# Patient Record
Sex: Female | Born: 1990 | Race: Black or African American | Hispanic: No | Smoking: Never smoker
Health system: Southern US, Community
[De-identification: ages and names within clinical notes are randomized; demographics above are authoritative.]

---

## 2011-06-23 ENCOUNTER — Other Ambulatory Visit: Payer: Self-pay | Admitting: Family Medicine

## 2011-06-23 DIAGNOSIS — N644 Mastodynia: Secondary | ICD-10-CM

## 2011-06-25 ENCOUNTER — Ambulatory Visit
Admission: RE | Admit: 2011-06-25 | Discharge: 2011-06-25 | Disposition: A | Discharge status: Home / Self Care | Payer: PRIVATE HEALTH INSURANCE | Source: Ambulatory Visit | Attending: Family Medicine | Admitting: Family Medicine

## 2011-06-25 DIAGNOSIS — N644 Mastodynia: Secondary | ICD-10-CM

## 2012-11-25 ENCOUNTER — Ambulatory Visit: Payer: Self-pay | Admitting: Physician Assistant

## 2012-11-25 VITALS — BP 100/60 | HR 68 | Temp 98.5°F | Resp 18 | Ht 66.5 in | Wt 157.8 lb

## 2012-11-25 DIAGNOSIS — B9689 Other specified bacterial agents as the cause of diseases classified elsewhere: Secondary | ICD-10-CM

## 2012-11-25 DIAGNOSIS — N898 Other specified noninflammatory disorders of vagina: Secondary | ICD-10-CM

## 2012-11-25 DIAGNOSIS — N72 Inflammatory disease of cervix uteri: Secondary | ICD-10-CM

## 2012-11-25 LAB — POCT WET PREP WITH KOH
KOH Prep POC: NEGATIVE
RBC Wet Prep HPF POC: NEGATIVE
Yeast Wet Prep HPF POC: NEGATIVE

## 2012-11-25 MED ORDER — METRONIDAZOLE 500 MG PO TABS: 500.0000 mg | ORAL_TABLET | Freq: Two times a day (BID) | ORAL | Status: DC

## 2012-11-25 NOTE — Progress Notes (Signed)
Subjective:    Patient ID: Lauren Crawford, female    DOB: 06/14/1990, 22 y.o.   MRN: [id number]  HPI   Lauren Crawford is a pleasant 22 yr old female here with concern for vaginal discharge.  Noticed this two days ago. Discharge is associated with a fishy odor.  Has some irritation and itching as well.  Has had BV in the past and this is similar to other episodes.  She denies urinary symptoms.  LMP 11/06/12.  She is sexually active with 1 female partner.  No condoms.  No concern for STI, declines testing today.  Last testing about 1 yr ago, same partner.  She is not using anything for contraception.  She is not trying to conceive.  Tried nuvaring but didn't like it due to breast tenderness.  Has never tried anything else.  Concerned for SEs and does not want to take a pill everyday.     Review of Systems  Constitutional: Negative.   HENT: Negative.   Respiratory: Negative.   Cardiovascular: Negative.   Gastrointestinal: Negative.   Genitourinary: Positive for vaginal discharge. Negative for dysuria, urgency, frequency, hematuria, flank pain, genital sores, menstrual problem, pelvic pain and dyspareunia.  Musculoskeletal: Negative.   Skin: Negative.   Neurological: Negative.        Objective:   Physical Exam  Vitals reviewed. Constitutional: She is oriented to person, place, and time. She appears well-developed and well-nourished. No distress.  HENT:  Head: Normocephalic and atraumatic.  Eyes: Conjunctivae are normal. No scleral icterus.  Cardiovascular: Normal rate, regular rhythm and normal heart sounds.   Pulmonary/Chest: Effort normal and breath sounds normal. She has no wheezes. She has no rales.  Abdominal: Soft. Bowel sounds are normal. There is no tenderness.  Genitourinary: Uterus normal. There is no rash, tenderness or lesion on the right labia. There is no rash, tenderness or lesion on the left labia. Cervix exhibits no motion tenderness, no discharge and no friability. Right adnexum  displays no mass, no tenderness and no fullness. Left adnexum displays no mass, no tenderness and no fullness. Vaginal discharge (think, white) found.  Neurological: She is alert and oriented to person, place, and time.  Skin: Skin is warm and dry.  Psychiatric: She has a normal mood and affect. Her behavior is normal.    Results for orders placed in visit on 11/25/12  POCT WET PREP WITH KOH      Result Value Range   Trichomonas, UA Negative     Clue Cells Wet Prep HPF POC 0-6     Epithelial Wet Prep HPF POC 21-30     Yeast Wet Prep HPF POC neg     Bacteria Wet Prep HPF POC 4+     RBC Wet Prep HPF POC neg     WBC Wet Prep HPF POC 0-2     KOH Prep POC Negative         Assessment & Plan:  Bacterial vaginosis - Plan: metroNIDAZOLE (FLAGYL) 500 MG tablet  Discharge from the vagina - Plan: POCT Wet Prep with KOH, CANCELED: POCT urinalysis dipstick, CANCELED: POCT UA - Microscopic Only   Lauren Crawford is a 22 yr old female with bacterial vaginosis.  Will treat with PO Flagyl x 7 days.  Discussed avoidance of etoh.  Discussed contraception options with pt.  She is averse to taking a pill daily but may be open to other methods.  I have provided educational materials to her at her request.  Pt to follow  up if she would like to begin contraception or if symptoms do not improve.

## 2012-11-25 NOTE — Patient Instructions (Signed)
Take the metronidazole as directed.  DO NOT CONSUME ALCOHOL WITH THIS MEDICATION or for 48 hours after your last dose.  Please let us know if any symptoms are worsening or not improving.  Bacterial Vaginosis Bacterial vaginosis (BV) is a vaginal infection where the normal balance of bacteria in the vagina is disrupted. The normal balance is then replaced by an overgrowth of certain bacteria. There are several different kinds of bacteria that can cause BV. BV is the most common vaginal infection in women of childbearing age. CAUSES   The cause of BV is not fully understood. BV develops when there is an increase or imbalance of harmful bacteria.  Some activities or behaviors can upset the normal balance of bacteria in the vagina and put women at increased risk including:  Having a new sex partner or multiple sex partners.  Douching.  Using an intrauterine device (IUD) for contraception.  It is not clear what role sexual activity plays in the development of BV. However, women that have never had sexual intercourse are rarely infected with BV. Women do not get BV from toilet seats, bedding, swimming pools or from touching objects around them.  SYMPTOMS   Grey vaginal discharge.  A fish-like odor with discharge, especially after sexual intercourse.  Itching or burning of the vagina and vulva.  Burning or pain with urination.  Some women have no signs or symptoms at all. DIAGNOSIS  Your caregiver must examine the vagina for signs of BV. Your caregiver will perform lab tests and look at the sample of vaginal fluid through a microscope. They will look for bacteria and abnormal cells (clue cells), a pH test higher than 4.5, and a positive amine test all associated with BV.  RISKS AND COMPLICATIONS   Pelvic inflammatory disease (PID).  Infections following gynecology surgery.  Developing HIV.  Developing herpes virus. TREATMENT  Sometimes BV will clear up without treatment. However,  all women with symptoms of BV should be treated to avoid complications, especially if gynecology surgery is planned. Female partners generally do not need to be treated. However, BV may spread between female sex partners so treatment is helpful in preventing a recurrence of BV.   BV may be treated with antibiotics. The antibiotics come in either pill or vaginal cream forms. Either can be used with nonpregnant or pregnant women, but the recommended dosages differ. These antibiotics are not harmful to the baby.  BV can recur after treatment. If this happens, a second round of antibiotics will often be prescribed.  Treatment is important for pregnant women. If not treated, BV can cause a premature delivery, especially for a pregnant woman who had a premature birth in the past. All pregnant women who have symptoms of BV should be checked and treated.  For chronic reoccurrence of BV, treatment with a type of prescribed gel vaginally twice a week is helpful. HOME CARE INSTRUCTIONS   Finish all medication as directed by your caregiver.  Do not have sex until treatment is completed.  Tell your sexual partner that you have a vaginal infection. They should see their caregiver and be treated if they have problems, such as a mild rash or itching.  Practice safe sex. Use condoms. Only have 1 sex partner. PREVENTION  Basic prevention steps can help reduce the risk of upsetting the natural balance of bacteria in the vagina and developing BV:  Do not have sexual intercourse (be abstinent).  Do not douche.  Use all of the medicine prescribed for treatment  of BV, even if the signs and symptoms go away.  Tell your sex partner if you have BV. That way, they can be treated, if needed, to prevent reoccurrence. SEEK MEDICAL CARE IF:   Your symptoms are not improving after 3 days of treatment.  You have increased discharge, pain, or fever. MAKE SURE YOU:   Understand these instructions.  Will watch your  condition.  Will get help right away if you are not doing well or get worse. FOR MORE INFORMATION  Division of STD Prevention (DSTDP), Centers for Disease Control and Prevention: [employer] American Social Health Association (ASHA): www.ashastd.org  Document Released: 02/03/2005 Document Revised: 04/28/2011 Document Reviewed: 07/27/2008 [facility] Patient Information 2014 [place], [place].

## 2012-12-02 ENCOUNTER — Ambulatory Visit: Payer: Self-pay | Admitting: Family Medicine

## 2012-12-02 ENCOUNTER — Encounter (HOSPITAL_COMMUNITY): Payer: Self-pay | Admitting: Emergency Medicine

## 2012-12-02 ENCOUNTER — Telehealth: Payer: Self-pay | Admitting: *Deleted

## 2012-12-02 ENCOUNTER — Emergency Department (HOSPITAL_COMMUNITY): Payer: PRIVATE HEALTH INSURANCE

## 2012-12-02 ENCOUNTER — Emergency Department (HOSPITAL_COMMUNITY)
Admission: EM | Admit: 2012-12-02 | Discharge: 2012-12-02 | Disposition: A | Discharge status: Home / Self Care | Payer: Self-pay | Attending: Emergency Medicine | Admitting: Emergency Medicine

## 2012-12-02 ENCOUNTER — Encounter: Payer: Self-pay | Admitting: Family Medicine

## 2012-12-02 VITALS — BP 120/76 | HR 80 | Temp 98.3°F | Resp 16 | Ht 67.0 in | Wt 158.4 lb

## 2012-12-02 DIAGNOSIS — N898 Other specified noninflammatory disorders of vagina: Secondary | ICD-10-CM | POA: Insufficient documentation

## 2012-12-02 DIAGNOSIS — R1031 Right lower quadrant pain: Secondary | ICD-10-CM

## 2012-12-02 DIAGNOSIS — R110 Nausea: Secondary | ICD-10-CM | POA: Insufficient documentation

## 2012-12-02 DIAGNOSIS — Z3202 Encounter for pregnancy test, result negative: Secondary | ICD-10-CM | POA: Insufficient documentation

## 2012-12-02 DIAGNOSIS — N390 Urinary tract infection, site not specified: Secondary | ICD-10-CM

## 2012-12-02 DIAGNOSIS — Z792 Long term (current) use of antibiotics: Secondary | ICD-10-CM | POA: Insufficient documentation

## 2012-12-02 DIAGNOSIS — Z79899 Other long term (current) drug therapy: Secondary | ICD-10-CM | POA: Insufficient documentation

## 2012-12-02 DIAGNOSIS — N926 Irregular menstruation, unspecified: Secondary | ICD-10-CM

## 2012-12-02 LAB — POCT UA - MICROSCOPIC ONLY
Casts, Ur, LPF, POC: NEGATIVE
Crystals, Ur, HPF, POC: NEGATIVE
Mucus, UA: NEGATIVE
Yeast, UA: NEGATIVE

## 2012-12-02 LAB — COMPREHENSIVE METABOLIC PANEL
ALT: 15 U/L (ref 0–35)
AST: 23 U/L (ref 0–37)
Albumin: 4.2 g/dL (ref 3.5–5.2)
Calcium: 9 mg/dL (ref 8.4–10.5)
Chloride: 103 mEq/L (ref 96–112)
Creatinine, Ser: 0.75 mg/dL (ref 0.50–1.10)
Potassium: 4.1 mEq/L (ref 3.5–5.1)
Sodium: 139 mEq/L (ref 135–145)
Total Bilirubin: 0.4 mg/dL (ref 0.3–1.2)

## 2012-12-02 LAB — WET PREP, GENITAL
Clue Cells Wet Prep HPF POC: NONE SEEN
Trich, Wet Prep: NONE SEEN

## 2012-12-02 LAB — URINALYSIS, ROUTINE W REFLEX MICROSCOPIC
Glucose, UA: NEGATIVE mg/dL
Protein, ur: 100 mg/dL — AB
Specific Gravity, Urine: 1.011 (ref 1.005–1.030)

## 2012-12-02 LAB — POCT URINALYSIS DIPSTICK
Bilirubin, UA: NEGATIVE
Glucose, UA: NEGATIVE
Ketones, UA: NEGATIVE
Nitrite, UA: POSITIVE
Protein, UA: 100
Spec Grav, UA: 1.015
Urobilinogen, UA: 0.2
pH, UA: 6

## 2012-12-02 LAB — CBC WITH DIFFERENTIAL/PLATELET
Basophils Absolute: 0 10*3/uL (ref 0.0–0.1)
Basophils Relative: 0 % (ref 0–1)
Eosinophils Relative: 0 % (ref 0–5)
HCT: 37.5 % (ref 36.0–46.0)
Hemoglobin: 12.8 g/dL (ref 12.0–15.0)
Lymphocytes Relative: 22 % (ref 12–46)
MCHC: 34.1 g/dL (ref 30.0–36.0)
MCV: 88 fL (ref 78.0–100.0)
Monocytes Absolute: 0.6 10*3/uL (ref 0.1–1.0)
Monocytes Relative: 8 % (ref 3–12)
RDW: 12.3 % (ref 11.5–15.5)

## 2012-12-02 LAB — POCT CBC
Granulocyte percent: 61.6 %G (ref 37–80)
HCT, POC: 38.8 % (ref 37.7–47.9)
Hemoglobin: 11.9 g/dL — AB (ref 12.2–16.2)
Lymph, poc: 2 (ref 0.6–3.4)
MCH, POC: 29.2 pg (ref 27–31.2)
MCHC: 30.7 g/dL — AB (ref 31.8–35.4)
MCV: 95.2 fL (ref 80–97)
MID (cbc): 0.3 (ref 0–0.9)
MPV: 8.2 fL (ref 0–99.8)
POC Granulocyte: 3.7 (ref 2–6.9)
POC LYMPH PERCENT: 33.5 % (ref 10–50)
POC MID %: 4.9 % (ref 0–12)
Platelet Count, POC: 215 10*3/uL (ref 142–424)
RBC: 4.08 M/uL (ref 4.04–5.48)
RDW, POC: 13.3 %
WBC: 6 10*3/uL (ref 4.6–10.2)

## 2012-12-02 LAB — URINE MICROSCOPIC-ADD ON

## 2012-12-02 LAB — POCT URINE PREGNANCY: Preg Test, Ur: NEGATIVE

## 2012-12-02 LAB — POCT I-STAT, CHEM 8
Chloride: 102 mEq/L (ref 96–112)
Glucose, Bld: 84 mg/dL (ref 70–99)
HCT: 42 % (ref 36.0–46.0)
Potassium: 3.6 mEq/L (ref 3.5–5.1)

## 2012-12-02 LAB — POCT PREGNANCY, URINE: Preg Test, Ur: NEGATIVE

## 2012-12-02 MED ORDER — DOXYCYCLINE HYCLATE 100 MG PO CAPS: 100.0000 mg | ORAL_CAPSULE | Freq: Two times a day (BID) | ORAL | Status: DC

## 2012-12-02 MED ORDER — CEPHALEXIN 500 MG PO CAPS: 500.0000 mg | ORAL_CAPSULE | Freq: Four times a day (QID) | ORAL | Status: DC

## 2012-12-02 MED ORDER — HYDROMORPHONE HCL PF 1 MG/ML IJ SOLN
0.5000 mg | Freq: Once | INTRAMUSCULAR | Status: AC
  Administered 2012-12-02: 0.5 mg via INTRAVENOUS
  Filled 2012-12-02: qty 1

## 2012-12-02 MED ORDER — AZITHROMYCIN 1 G PO PACK
1.0000 g | PACK | Freq: Once | ORAL | Status: AC
  Administered 2012-12-02: 1 g via ORAL
  Filled 2012-12-02: qty 1

## 2012-12-02 MED ORDER — DEXTROSE 5 % IV SOLN: 1.0000 g | Freq: Once | INTRAVENOUS | Status: DC

## 2012-12-02 MED ORDER — CIPROFLOXACIN HCL 500 MG PO TABS: 500.0000 mg | ORAL_TABLET | Freq: Two times a day (BID) | ORAL | Status: DC

## 2012-12-02 MED ORDER — IOHEXOL 300 MG/ML  SOLN
100.0000 mL | Freq: Once | INTRAMUSCULAR | Status: AC | PRN
  Administered 2012-12-02: 100 mL via INTRAVENOUS

## 2012-12-02 MED ORDER — CEFTRIAXONE SODIUM 1 G IJ SOLR
1.0000 g | Freq: Once | INTRAMUSCULAR | Status: AC
  Administered 2012-12-02: 1 g via INTRAMUSCULAR
  Filled 2012-12-02: qty 10

## 2012-12-02 NOTE — ED Provider Notes (Signed)
CSN: [id number]     Arrival date & time 12/02/12  1807 History   First MD Initiated Contact with Patient 12/02/12 1824     Chief Complaint  Patient presents with  . Abdominal Pain   (Consider location/radiation/quality/duration/timing/severity/associated sxs/prior Treatment) HPI Comments: Patient presents to the Ed with Right sided abdominal pain with radiation to back.  She reports abrupt onset of 10/10 pain started to day at 0700 with nausea without vomiting. She reports discomfort that lasts for 2 minutes. Further questioning revealed she was also seen by Dr. Lenell Antu, DO today. Reports sexually active without use of condoms. States she had malodors discharge for one week and was prescribed Metronidazole for BV 1 week ago and Cipro. Denies previous abdominal surgeries.  LNMP: 11/26/2012     Patient is a 22 y.o. female presenting with abdominal pain. The history is provided by the patient.  Abdominal Pain   History reviewed. No pertinent past medical history. History reviewed. No pertinent past surgical history. Family History  Problem Relation Age of Onset  . Cancer Maternal Grandmother     throat   History  Substance Use Topics  . Smoking status: Never Smoker   . Smokeless tobacco: Not on file  . Alcohol Use: No   OB History   Grav Para Term Preterm Abortions TAB SAB Ect Mult Living                 Review of Systems  Gastrointestinal: Positive for abdominal pain.  All other systems reviewed and are negative.    Allergies  Review of patient's allergies indicates no known allergies.  Home Medications   Current Outpatient Rx  Name  Route  Sig  Dispense  Refill  . ciprofloxacin (CIPRO) 500 MG tablet   Oral   Take 1 tablet (500 mg total) by mouth 2 (two) times daily.   20 tablet   0   . metroNIDAZOLE (FLAGYL) 500 MG tablet   Oral   Take 1 tablet (500 mg total) by mouth 2 (two) times daily with a meal. DO NOT CONSUME ALCOHOL WHILE TAKING THIS MEDICATION.   14  tablet   0    BP 111/77  Pulse 86  Temp(Src) 97.9 F (36.6 C) (Oral)  Resp 18  Ht 5\' 7"  (1.702 m)  Wt 160 lb (72.576 kg)  BMI 25.05 kg/m2  SpO2 100%  LMP 11/01/2012 Physical Exam  Nursing note and vitals reviewed. Constitutional: She appears well-developed and well-nourished.  HENT:  Head: Normocephalic and atraumatic.  Eyes: EOM are normal.  Neck: Neck supple.  Cardiovascular: Normal rate, regular rhythm and normal heart sounds.   Pulmonary/Chest: Effort normal and breath sounds normal. No respiratory distress. She has no wheezes. She has no rales.  Abdominal: Soft. Normal appearance. Bowel sounds are increased. There is tenderness in the right upper quadrant and right lower quadrant. There is guarding and CVA tenderness. There is no rigidity, no rebound and negative Murphy's sign.  Right CVA tenderness  Genitourinary: Pelvic exam was performed with patient supine. There is no rash or lesion on the right labia. There is no rash or lesion on the left labia. Cervix exhibits discharge. Cervix exhibits no motion tenderness. Right adnexum displays no mass and no tenderness. Left adnexum displays no mass and no tenderness. Vaginal discharge found.  Scant yellow discharge.  Musculoskeletal: She exhibits no edema.  Neurological: She is alert.  Skin: Skin is warm and dry. No rash noted.  Psychiatric: She has a normal mood  and affect.    ED Course  Procedures (including critical care time) Labs Review Labs Reviewed  URINALYSIS, ROUTINE W REFLEX MICROSCOPIC - Abnormal; Notable for the following:    APPearance TURBID (*)    Hgb urine dipstick LARGE (*)    Protein, ur 100 (*)    Nitrite POSITIVE (*)    Leukocytes, UA LARGE (*)    All other components within normal limits  URINE MICROSCOPIC-ADD ON - Abnormal; Notable for the following:    Bacteria, UA MANY (*)    All other components within normal limits  GC/CHLAMYDIA PROBE AMP  URINE CULTURE  WET PREP, GENITAL  WET PREP, GENITAL   CBC WITH DIFFERENTIAL  COMPREHENSIVE METABOLIC PANEL  POCT I-STAT, CHEM 8  POCT PREGNANCY, URINE   Imaging Review Ct Abdomen Pelvis W Contrast  12/02/2012   CLINICAL DATA:  Severe right lower quadrant abdominal pain radiating to the back.  EXAM: CT ABDOMEN AND PELVIS WITH CONTRAST  TECHNIQUE: Multidetector CT imaging of the abdomen and pelvis was performed using the standard protocol following bolus administration of intravenous contrast.  CONTRAST:  OMNIPAQUE IOHEXOL 300 MG/ML  SOLN  COMPARISON:  No priors.  FINDINGS: Lung Bases: Unremarkable.  Abdomen/Pelvis: The appearance of the liver, gallbladder, pancreas, spleen, bilateral adrenal glands and bilateral kidneys is unremarkable. No significant volume of ascites. No pneumoperitoneum. No pathologic distention of small bowel. No definite lymphadenopathy identified within the abdomen or pelvis. Normal appendix. Uterus and bilateral ovaries are unremarkable in appearance. Urinary bladder appears slightly thickened, but is otherwise unremarkable.  Musculoskeletal: There are no aggressive appearing lytic or blastic lesions noted in the visualized portions of the skeleton.  IMPRESSION: 1. Mild circumferential thickening of the urinary bladder wall may suggest cystitis. No other potential acute findings are noted in the abdomen and pelvis. 2. Specifically, the appendix is normal.   Electronically Signed   By: Trudie Reed M.D.   On: 12/02/2012 20:39    EKG Interpretation   None       MDM   1. UTI (urinary tract infection)   2. Vaginal discharge    Patient presents with R sided abdominal pain, EMR reveals outside work up.  Consider UTI, Kidney stone, Appendicitis, PID   Reevaluation: patient reports pain is now at a 2/10 after medication. Discussed UA with patient.  CT- negative for acute findings. Discussed results with patient and will give her a dose of rocephin here discharge her home with antibiotics for UTI, and treat her for  presumptive GC/C and follow up with PCP.  Meds given in ED:  Medications   HYDROmorphone (DILAUDID) injection 0.5 mg (0.5 mg Intravenous Given 12/02/12 1936)   iohexol (OMNIPAQUE) 300 MG/ML solution 100 mL (100 mLs Intravenous Contrast Given 12/02/12 2019)   cefTRIAXone (ROCEPHIN) injection 1 g (1 g Intramuscular Given 12/02/12 2128)   azithromycin (ZITHROMAX) powder 1 g (1 g Oral Given 12/02/12 2128)    Discharge Medication List as of 12/02/2012 9:19 PM     START taking these medications    Details   cephALEXin (KEFLEX) 500 MG capsule  Take 1 capsule (500 mg total) by mouth 4 (four) times daily., Starting 12/02/2012, Until Discontinued, Print     doxycycline (VIBRAMYCIN) 100 MG capsule  Take 1 capsule (100 mg total) by mouth 2 (two) times daily., Starting 12/02/2012, Until Discontinued, Print            Clabe Seal, PA-C 12/04/12 1609

## 2012-12-02 NOTE — ED Notes (Signed)
Pt finished oral contrast.  CT notified.

## 2012-12-02 NOTE — Progress Notes (Addendum)
[facility]:  Office Visit  Chief Complaint:  Chief Complaint  Patient presents with  . Abdominal Pain    being treated for BV x 2 days    HPI: Lauren Crawford is a 22 y.o. female who is here for right lower quadrant abd pain starting 6 am today after she ate oatmeal and tea, no other foods, from [employer] to take with her Flagyl. She is also on herbalife products and has been drinking that every day and has not had any problems. Associated with nausea, no vomiting. She has sharp and dull pain, constant, 10/10 with sharp pain. She ate a yogurt parfait and was able to tolerate it but was already in pain. She has chills when she has pain. Still has itching and smell in urine, She has 1 day left of her BV medication. + lower right back pain  No prior history of kidney stones, denies STDs, she denies ovarian cysts  She just got off her period but had 2 last month  History reviewed. No pertinent past medical history. History reviewed. No pertinent past surgical history. History   Social History  . Marital Status: Single    Spouse Name: N/A    Number of Children: N/A  . Years of Education: N/A   Social History Main Topics  . Smoking status: Never Smoker   . Smokeless tobacco: None  . Alcohol Use: No  . Drug Use: No  . Sexual Activity: Yes   Other Topics Concern  . None   Social History Narrative  . None   Family History  Problem Relation Age of Onset  . Cancer Maternal Grandmother     throat   No Known Allergies Prior to Admission medications   Medication Sig Start Date End Date Taking? Authorizing Provider  metroNIDAZOLE (FLAGYL) 500 MG tablet Take 1 tablet (500 mg total) by mouth 2 (two) times daily with a meal. DO NOT CONSUME ALCOHOL WHILE TAKING THIS MEDICATION. 11/25/12  Yes Eleanore E Debbra Riding, PA-C     ROS: The patient denies fevers, chills, night sweats, unintentional weight loss, chest pain, palpitations, wheezing, dyspnea on exertion, nausea, vomiting,  melena, numbness, weakness, or tingling.  All other systems have been reviewed and were otherwise negative with the exception of those mentioned in the HPI and as above.    PHYSICAL EXAM: Filed Vitals:   12/02/12 1547  BP: 120/76  Pulse: 80  Temp: 98.3 F (36.8 C)  Resp: 16   Filed Vitals:   12/02/12 1547  Height: 5\' 7"  (1.702 m)  Weight: 158 lb 6.4 oz (71.85 kg)   Body mass index is 24.8 kg/(m^2).  General: Alert, no acute distress HEENT:  Normocephalic, atraumatic, oropharynx patent. EOMI, PERRLA Cardiovascular:  Regular rate and rhythm, no rubs murmurs or gallops.  No Carotid bruits, radial pulse intact. No pedal edema.  Respiratory: Clear to auscultation bilaterally.  No wheezes, rales, or rhonchi.  No cyanosis, no use of accessory musculature GI: No organomegaly, abdomen is soft and non-tender, positive bowel sounds.  No masses. Skin: No rashes. Neurologic: Facial musculature symmetric. Psychiatric: Patient is appropriate throughout our interaction. Lymphatic: No cervical lymphadenopathy Musculoskeletal: Gait intact. No rebound tenderness + guarding + exqusiitely tender on RLQ + CVA tenderness   LABS: Results for orders placed in visit on 12/02/12  POCT CBC      Result Value Range   WBC 6.0  4.6 - 10.2 K/uL   Lymph, poc 2.0  0.6 - 3.4   POC  LYMPH PERCENT 33.5  10 - 50 %L   MID (cbc) 0.3  0 - 0.9   POC MID % 4.9  0 - 12 %M   POC Granulocyte 3.7  2 - 6.9   Granulocyte percent 61.6  37 - 80 %G   RBC 4.08  4.04 - 5.48 M/uL   Hemoglobin 11.9 (*) 12.2 - 16.2 g/dL   HCT, POC 40.9  81.1 - 47.9 %   MCV 95.2  80 - 97 fL   MCH, POC 29.2  27 - 31.2 pg   MCHC 30.7 (*) 31.8 - 35.4 g/dL   RDW, POC 91.4     Platelet Count, POC 215  142 - 424 K/uL   MPV 8.2  0 - 99.8 fL  POCT UA - MICROSCOPIC ONLY      Result Value Range   WBC, Ur, HPF, POC tntc     RBC, urine, microscopic tntc     Bacteria, U Microscopic 4+     Mucus, UA neg     Epithelial cells, urine per micros  0-1     Crystals, Ur, HPF, POC neg     Casts, Ur, LPF, POC neg     Yeast, UA neg    POCT URINALYSIS DIPSTICK      Result Value Range   Color, UA yellow     Clarity, UA turbid     Glucose, UA neg     Bilirubin, UA neg     Ketones, UA neg     Spec Grav, UA 1.015     Blood, UA large     pH, UA 6.0     Protein, UA 100     Urobilinogen, UA 0.2     Nitrite, UA positive     Leukocytes, UA large (3+)    POCT URINE PREGNANCY      Result Value Range   Preg Test, Ur Negative       EKG/XRAY:   Primary read interpreted by Dr. Conley Rolls at [facility].   ASSESSMENT/PLAN: Encounter Diagnoses  Name Primary?  . Abdominal pain, acute, right lower quadrant Yes  . UTI (urinary tract infection)   . Irregular menses    She has a UTI After much discussion about her sxs and the possible causes she declined to get further outpatient care and wanted to go to ER for more definitive dx with CT scan Patient wanted to go to ER instead of getting out patient CT, she is exquisitely tender and guarding  so possible early appendicitis vs ovarian cyst (just finished having her period) , declined rocephin injection in office Rx Cipro 500 mg BID x 10 Days sent to pharmacy Will culture urine Will get uriprobe for GC She is going to ER by personal vehicle since wants definitive dx now Gross sideeffects, risk and benefits, and alternatives of medications d/w patient. Patient is aware that all medications have potential sideeffects and we are unable to predict every sideeffect or drug-drug interaction that may occur.  Hamilton Capri PHUONG, DO 12/02/2012 6:13 PM

## 2012-12-02 NOTE — ED Notes (Addendum)
Pt states she was tx with flagyl for a vaginal infection 1 week ago.  Pt began experiencing severe RLQ pain that radiates to her back while at work today.  Pt states her urine still has a "fishy smell", is cloudy and burns when she urinates.

## 2012-12-02 NOTE — ED Provider Notes (Signed)
22 year old female with no prior abdominal surgical history who presents from the urgent care with a diagnosis of a urinary infection with right lower quadrant pain. The providers at the urgent care wanted to pursue further evaluation but the patient said she wanted to come to the emergency department instead. On my exam she has tenderness in the right lower quadrant with guarding, no other abdominal tenderness and no peritoneal signs. She had a normal blood count that was drawn just prior to arrival, her urinalysis does reveal urinary infection. Because of the location of her pain we'll also consider appendicitis as a possible source of her pyuria. Otherwise the patient appears hemodynamically stable and is pain-free and was examined after receiving intravenous medications.  CT negative  Medical screening examination/treatment/procedure(s) were conducted as a shared visit with non-physician practitioner(s) and myself.  I personally evaluated the patient during the encounter.  Clinical Impression: Abdominal Pain, UTI      Vida Roller, MD 12/03/12 0001

## 2012-12-02 NOTE — Telephone Encounter (Signed)
Per Dr. Conley Rolls, called and left message on cell phone negative pregnancy test.  Carollee Leitz, CMA.

## 2012-12-03 ENCOUNTER — Encounter: Payer: Self-pay | Admitting: Family Medicine

## 2012-12-03 LAB — GC/CHLAMYDIA PROBE AMP
CT Probe RNA: NEGATIVE
CT Probe RNA: NEGATIVE
GC Probe RNA: NEGATIVE
GC Probe RNA: NEGATIVE

## 2012-12-04 LAB — URINE CULTURE
Colony Count: >=100000
Colony Count: >=100000

## 2012-12-05 ENCOUNTER — Telehealth (HOSPITAL_COMMUNITY): Payer: Self-pay | Admitting: Emergency Medicine

## 2012-12-05 NOTE — ED Notes (Signed)
Post ED Visit - Positive Culture Follow-up ° °Culture report reviewed by antimicrobial stewardship pharmacist: °[] Wes Dulaney, Pharm.D., BCPS °[] Jeremy Frens, Pharm.D., BCPS °[] Elizabeth Martin, Pharm.D., BCPS °[] Minh Pham, Pharm.D., BCPS, AAHIVP °[] Michelle Turner, Pharm.D., BCPS, AAHIVP °[x] Nita Johnston, Pharm.D., BCPS ° °Positive urine culture °Treated with Keflex, organism sensitive to the same and no further patient follow-up is required at this time. ° °Lauren Crawford °12/05/2012, 1:23 PM ° ° °

## 2012-12-06 NOTE — ED Provider Notes (Signed)
Medical screening examination/treatment/procedure(s) were conducted as a shared visit with non-physician practitioner(s) and myself.  I personally evaluated the patient during the encounter  Please see my separate respective documentation pertaining to this patient encounter   Vida Roller, MD 12/06/12 [phone]

## 2013-04-27 ENCOUNTER — Ambulatory Visit (INDEPENDENT_AMBULATORY_CARE_PROVIDER_SITE_OTHER): Payer: BC Managed Care – PPO | Admitting: Family Medicine

## 2013-04-27 VITALS — BP 120/70 | HR 88 | Temp 98.4°F | Resp 16 | Ht 67.0 in | Wt 160.0 lb

## 2013-04-27 DIAGNOSIS — R141 Gas pain: Secondary | ICD-10-CM

## 2013-04-27 DIAGNOSIS — R140 Abdominal distension (gaseous): Secondary | ICD-10-CM

## 2013-04-27 DIAGNOSIS — Z3009 Encounter for other general counseling and advice on contraception: Secondary | ICD-10-CM

## 2013-04-27 DIAGNOSIS — R1032 Left lower quadrant pain: Secondary | ICD-10-CM

## 2013-04-27 DIAGNOSIS — D649 Anemia, unspecified: Secondary | ICD-10-CM

## 2013-04-27 DIAGNOSIS — K5900 Constipation, unspecified: Secondary | ICD-10-CM

## 2013-04-27 DIAGNOSIS — R142 Eructation: Secondary | ICD-10-CM

## 2013-04-27 DIAGNOSIS — R143 Flatulence: Secondary | ICD-10-CM

## 2013-04-27 LAB — POCT CBC
Granulocyte percent: 49.9 %G (ref 37–80)
HCT, POC: 35.1 % — AB (ref 37.7–47.9)
Hemoglobin: 10.8 g/dL — AB (ref 12.2–16.2)
Lymph, poc: 1.7 (ref 0.6–3.4)
MCH, POC: 28.5 pg (ref 27–31.2)
MCHC: 30.8 g/dL — AB (ref 31.8–35.4)
MCV: 92.5 fL (ref 80–97)
MID (cbc): 0.3 (ref 0–0.9)
MPV: 8.7 fL (ref 0–99.8)
POC Granulocyte: 2 (ref 2–6.9)
POC LYMPH %: 43.7 % (ref 10–50)
POC MID %: 6.4 %M (ref 0–12)
Platelet Count, POC: 211 10*3/uL (ref 142–424)
RBC: 3.79 M/uL — AB (ref 4.04–5.48)
RDW, POC: 13 %
WBC: 4 10*3/uL — AB (ref 4.6–10.2)

## 2013-04-27 LAB — POCT UA - MICROSCOPIC ONLY
CASTS, UR, LPF, POC: NEGATIVE
Crystals, Ur, HPF, POC: NEGATIVE
Mucus, UA: NEGATIVE
RBC, URINE, MICROSCOPIC: NEGATIVE
Yeast, UA: NEGATIVE

## 2013-04-27 LAB — POCT URINALYSIS DIPSTICK
Bilirubin, UA: NEGATIVE
Blood, UA: NEGATIVE
Glucose, UA: NEGATIVE
KETONES UA: NEGATIVE
Leukocytes, UA: NEGATIVE
Nitrite, UA: NEGATIVE
PH UA: 7
PROTEIN UA: NEGATIVE
SPEC GRAV UA: 1.02
UROBILINOGEN UA: 0.2

## 2013-04-27 LAB — POCT URINE PREGNANCY: Preg Test, Ur: NEGATIVE

## 2013-04-27 NOTE — Patient Instructions (Addendum)
Look at paperwork on contraception options from Up to Date. , then let me know your preference to determine if we need to refer you to gynecologist.   You should receive a call or letter about your lab results within the next week to 10 days.   Increase fiber and water in diet to lessen chance of constipation, but if bloating not improved in next week or two - can recheck to discuss other causes or refer you to a gastroenterologist.   Recheck blood count in next 2-4 weeks - sooner if fatigue is worsening, but try to eat foods rich in iron until then.   If hard stools, or unable to have bowel movement - Miralax over the counter.   Return to the clinic or go to the nearest emergency room if any of your symptoms worsen or new symptoms occur.  Anemia, Nonspecific Anemia is a condition in which the concentration of red blood cells or hemoglobin in the blood is below normal. Hemoglobin is a substance in red blood cells that carries oxygen to the tissues of the body. Anemia results in not enough oxygen reaching these tissues.  CAUSES  Common causes of anemia include:   Excessive bleeding. Bleeding may be internal or external. This includes excessive bleeding from periods (in women) or from the intestine.   Poor nutrition.   Chronic kidney, thyroid, and liver disease.  Bone marrow disorders that decrease red blood cell production.  Cancer and treatments for cancer.  HIV, AIDS, and their treatments.  Spleen problems that increase red blood cell destruction.  Blood disorders.  Excess destruction of red blood cells due to infection, medicines, and autoimmune disorders. SIGNS AND SYMPTOMS   Minor weakness.   Dizziness.   Headache.  Palpitations.   Shortness of breath, especially with exercise.   Paleness.  Cold sensitivity.  Indigestion.  Nausea.  Difficulty sleeping.  Difficulty concentrating. Symptoms may occur suddenly or they may develop slowly.  DIAGNOSIS   Additional blood tests are often needed. These help your health care provider determine the best treatment. Your health care provider will check your stool for blood and look for other causes of blood loss.  TREATMENT  Treatment varies depending on the cause of the anemia. Treatment can include:   Supplements of iron, vitamin B12, or folic acid.   Hormone medicines.   A blood transfusion. This may be needed if blood loss is severe.   Hospitalization. This may be needed if there is significant continual blood loss.   Dietary changes.  Spleen removal. HOME CARE INSTRUCTIONS Keep all follow-up appointments. It often takes many weeks to correct anemia, and having your health care provider check on your condition and your response to treatment is very important. SEEK IMMEDIATE MEDICAL CARE IF:   You develop extreme weakness, shortness of breath, or chest pain.   You become dizzy or have trouble concentrating.  You develop heavy vaginal bleeding.   You develop a rash.   You have bloody or black, tarry stools.   You faint.   You vomit up blood.   You vomit repeatedly.   You have abdominal pain.  You have a fever or persistent symptoms for more than 2 3 days.   You have a fever and your symptoms suddenly get worse.   You are dehydrated.  MAKE SURE YOU:  Understand these instructions.  Will watch your condition.  Will get help right away if you are not doing well or get worse. Document Released: 03/13/2004 Document  Revised: 10/06/2012 Document Reviewed: 07/30/2012 [facility] Patient Information 2014 [place], [place].    Constipation, Adult Constipation is when a person has fewer than 3 bowel movements a week; has difficulty having a bowel movement; or has stools that are dry, hard, or larger than normal. As people grow older, constipation is more common. If you try to fix constipation with medicines that make you have a bowel movement (laxatives), the  problem may get worse. Long-term laxative use may cause the muscles of the colon to become weak. A low-fiber diet, not taking in enough fluids, and taking certain medicines may make constipation worse. CAUSES   Certain medicines, such as antidepressants, pain medicine, iron supplements, antacids, and water pills.   Certain diseases, such as diabetes, irritable bowel syndrome (IBS), thyroid disease, or depression.   Not drinking enough water.   Not eating enough fiber-rich foods.   Stress or travel.  Lack of physical activity or exercise.  Not going to the restroom when there is the urge to have a bowel movement.  Ignoring the urge to have a bowel movement.  Using laxatives too much. SYMPTOMS   Having fewer than 3 bowel movements a week.   Straining to have a bowel movement.   Having hard, dry, or larger than normal stools.   Feeling full or bloated.   Pain in the lower abdomen.  Not feeling relief after having a bowel movement. DIAGNOSIS  Your caregiver will take a medical history and perform a physical exam. Further testing may be done for severe constipation. Some tests may include:   A barium enema X-ray to examine your rectum, colon, and sometimes, your small intestine.  A sigmoidoscopy to examine your lower colon.  A colonoscopy to examine your entire colon. TREATMENT  Treatment will depend on the severity of your constipation and what is causing it. Some dietary treatments include drinking more fluids and eating more fiber-rich foods. Lifestyle treatments may include regular exercise. If these diet and lifestyle recommendations do not help, your caregiver may recommend taking over-the-counter laxative medicines to help you have bowel movements. Prescription medicines may be prescribed if over-the-counter medicines do not work.  HOME CARE INSTRUCTIONS   Increase dietary fiber in your diet, such as fruits, vegetables, whole grains, and beans. Limit high-fat  and processed sugars in your diet, such as [place] fries, hamburgers, cookies, candies, and soda.   A fiber supplement may be added to your diet if you cannot get enough fiber from foods.   Drink enough fluids to keep your urine clear or pale yellow.   Exercise regularly or as directed by your caregiver.   Go to the restroom when you have the urge to go. Do not hold it.  Only take medicines as directed by your caregiver. Do not take other medicines for constipation without talking to your caregiver first. SEEK IMMEDIATE MEDICAL CARE IF:   You have bright red blood in your stool.   Your constipation lasts for more than 4 days or gets worse.   You have abdominal or rectal pain.   You have thin, pencil-like stools.  You have unexplained weight loss. MAKE SURE YOU:   Understand these instructions.  Will watch your condition.  Will get help right away if you are not doing well or get worse. Document Released: 11/02/2003 Document Revised: 04/28/2011 Document Reviewed: 11/15/2012 [facility] Patient Information 2014 [place], [place].

## 2013-04-27 NOTE — Progress Notes (Addendum)
This chart was scribed for Lauren StaggersJeffrey Fairley Copher, MD by Lauren Crawford, ED Scribe. This patient was seen in room 11 and the patient's care was started at 4:25 PM. Subjective:    Patient ID: Lauren Crawford, female    DOB: 06/26/1990, 23 y.o.   MRN: [id number]  Chief Complaint  Patient presents with  . Constipation    x 2 week  . Bloated  . sleepy    HPI Latina Lauren AngMonde is a 23 y.o. female PCP: No PCP Per Patient  Pt was seen here in the office on 11/2012 for abdominal pain, followed by ED evaluation. Normal CBC, but CT scan was obtained at that ED visit. Mild thickening of urinary bladder wall suggesting cystitis, otherwise negative.   Pt is complaining of constant constipation that started 2 weeks ago. Pt is also complaining of associated bloating and fatigue. She states that she is usually constipated, so this in not unusual for her. Usual BM is once a day. She describes her stool as being hard in nature. She reports taking a stool softener last week, with some relief. What she is really concerned about is her stomach bloating. She denies any history of stomach issues. Pt states that she started experiencing sharp pains in the lower part in her abdomen. Her last BM was this morning. Pt states that she has gained unexpected weight (3lbs) in the last 2 weeks due to her associated symptoms. She's also complaining of pressure with urination but not burning. Denies any emesis, fever, dysuria, frequency, vaginal discharge (had some discharge a few weeks ago, but none recently) or hematuria. Denies any history of STDs.   LMP: 04/14/13; she states that her period usually last for 5 days but this time it lasted for 3 days. Pt states that she uses condoms as a method of birth control. She is not always constant with her birth control method.   Pt is also inquiring about a new method of birth control. She states that she's tried Nuvaring before. She is thinking about getting an IUD. Advised pt that we don't place those  here at the office, but I am more than willing to give her a referral to a local OB/GYN.  There are no active problems to display for this patient.  History reviewed. No pertinent past medical history. History reviewed. No pertinent past surgical history. No Known Allergies Prior to Admission medications   Medication Sig Start Date End Date Taking? Authorizing Provider  cephALEXin (KEFLEX) 500 MG capsule Take 1 capsule (500 mg total) by mouth 4 (four) times daily. 12/02/12   Lauren Doretha ImusM Parker, PA-C  ciprofloxacin (CIPRO) 500 MG tablet Take 1 tablet (500 mg total) by mouth 2 (two) times daily. 12/02/12   Lauren P Le, DO  doxycycline (VIBRAMYCIN) 100 MG capsule Take 1 capsule (100 mg total) by mouth 2 (two) times daily. 12/02/12   Lauren Doretha ImusM Parker, PA-C  metroNIDAZOLE (FLAGYL) 500 MG tablet Take 1 tablet (500 mg total) by mouth 2 (two) times daily with a meal. DO NOT CONSUME ALCOHOL WHILE TAKING THIS MEDICATION. 11/25/12   Lauren PickEleanore E Egan, PA-C   History   Social History  . Marital Status: Single    Spouse Name: N/A    Number of Children: N/A  . Years of Education: N/A   Occupational History  . Not on file.   Social History Main Topics  . Smoking status: Never Smoker   . Smokeless tobacco: Not on file  . Alcohol Use: No  . Drug  Use: No  . Sexual Activity: Yes   Other Topics Concern  . Not on file   Social History Narrative  . No narrative on file   Review of Systems  Constitutional: Positive for fatigue and unexpected weight change.  Respiratory: Negative for chest tightness and shortness of breath.   Cardiovascular: Negative for chest pain, palpitations and leg swelling.  Gastrointestinal: Positive for nausea, constipation and abdominal distention (bloating). Negative for blood in stool.  Genitourinary: Negative for frequency and vaginal discharge.  Neurological: Negative for dizziness, syncope, light-headedness and headaches.       Objective:   Physical Exam  Vitals  reviewed. Constitutional: She is oriented to person, place, and time. She appears well-developed and well-nourished.  HENT:  Head: Normocephalic and atraumatic.  Right Ear: Tympanic membrane normal.  Left Ear: Tympanic membrane normal.  Mouth/Throat: Oropharynx is clear and moist. No posterior oropharyngeal edema or posterior oropharyngeal erythema.  Eyes: Conjunctivae and EOM are normal. Pupils are equal, round, and reactive to light.  Neck: Normal range of motion. Neck supple. Carotid bruit is not present.  Slight prominence of thyroid without nodules.  Cardiovascular: Normal rate, regular rhythm, normal heart sounds and intact distal pulses.  Exam reveals no gallop and no friction rub.   No murmur heard. Pulmonary/Chest: Effort normal and breath sounds normal. No respiratory distress. She has no wheezes. She has no rales.  Abdominal: Soft. She exhibits no pulsatile midline mass. There is no tenderness.  Neurological: She is alert and oriented to person, place, and time.  Skin: Skin is warm and dry.  Psychiatric: She has a normal mood and affect. Her behavior is normal.   Filed Vitals:   04/27/13 1609  BP: 120/70  Pulse: 88  Temp: 98.4 F (36.9 C)  TempSrc: Oral  Resp: 16  Height: 5\' 7"  (1.702 m)  Weight: 160 lb (72.576 kg)  SpO2: 100%    Results for orders placed in visit on 04/27/13  POCT CBC      Result Value Ref Range   WBC 4.0 (*) 4.6 - 10.2 K/uL   Lymph, poc 1.7  0.6 - 3.4   POC LYMPH PERCENT 43.7  10 - 50 %L   MID (cbc) 0.3  0 - 0.9   POC MID % 6.4  0 - 12 %M   POC Granulocyte 2.0  2 - 6.9   Granulocyte percent 49.9  37 - 80 %G   RBC 3.79 (*) 4.04 - 5.48 M/uL   Hemoglobin 10.8 (*) 12.2 - 16.2 g/dL   HCT, POC 78.2 (*) 95.6 - 47.9 %   MCV 92.5  80 - 97 fL   MCH, POC 28.5  27 - 31.2 pg   MCHC 30.8 (*) 31.8 - 35.4 g/dL   RDW, POC 21.3     Platelet Count, POC 211  142 - 424 K/uL   MPV 8.7  0 - 99.8 fL  POCT URINE PREGNANCY      Result Value Ref Range   Preg  Test, Ur Negative    POCT UA - MICROSCOPIC ONLY      Result Value Ref Range   WBC, Ur, HPF, POC 0-1     RBC, urine, microscopic neg     Bacteria, U Microscopic trace     Mucus, UA neg     Epithelial cells, urine per micros 2-6     Crystals, Ur, HPF, POC neg     Casts, Ur, LPF, POC neg     Yeast, UA neg  POCT URINALYSIS DIPSTICK      Result Value Ref Range   Color, UA yellow     Clarity, UA clear     Glucose, UA neg     Bilirubin, UA neg     Ketones, UA neg     Spec Grav, UA 1.020     Blood, UA neg     pH, UA 7.0     Protein, UA neg     Urobilinogen, UA 0.2     Nitrite, UA neg     Leukocytes, UA Negative          Assessment & Plan:  Alexa Mischke is a 23 y.o. female Abdominal pain, left lower quadrant,  Bloating , Constipation- Plan: POCT CBC, POCT urine pregnancy, POCT UA - Microscopic Only, POCT urinalysis dipstick, Lipase  - suspect constipation component, along with bloating.  abd exam, U/a, and normal WBC on cbc reassuring.  Labs above and TSH with c/o chronic constipation, but trial of incr fiber and water in diet, then if not improving  refer to GI. Sooner or to ER if worse.   Encounter for counseling regarding contraception - multiple handouts on options from Up to Date, and discussed importance of strict condom use in the meantime.   Anemia - Plan: Iron and TIBC, repeat CBC in 2-4 weeks, sooner if increasing fatigue or dark/tarry stools.   RTC precautions discussed, understanding expressed.    No orders of the defined types were placed in this encounter.   Patient Instructions  Look at paperwork on contraception options from Up to Date. , then let me know your preference to determine if we need to refer you to gynecologist.   You should receive a call or letter about your lab results within the next week to 10 days.   Increase fiber and water in diet to lessen chance of constipation, but if bloating not improved in next week or two - can recheck to discuss other  causes or refer you to a gastroenterologist.   Recheck blood count in next 2-4 weeks - sooner if fatigue is worsening, but try to eat foods rich in iron until then.   If hard stools, or unable to have bowel movement - Miralax over the counter.   Return to the clinic or go to the nearest emergency room if any of your symptoms worsen or new symptoms occur.  Anemia, Nonspecific Anemia is a condition in which the concentration of red blood cells or hemoglobin in the blood is below normal. Hemoglobin is a substance in red blood cells that carries oxygen to the tissues of the body. Anemia results in not enough oxygen reaching these tissues.  CAUSES  Common causes of anemia include:   Excessive bleeding. Bleeding may be internal or external. This includes excessive bleeding from periods (in women) or from the intestine.   Poor nutrition.   Chronic kidney, thyroid, and liver disease.  Bone marrow disorders that decrease red blood cell production.  Cancer and treatments for cancer.  HIV, AIDS, and their treatments.  Spleen problems that increase red blood cell destruction.  Blood disorders.  Excess destruction of red blood cells due to infection, medicines, and autoimmune disorders. SIGNS AND SYMPTOMS   Minor weakness.   Dizziness.   Headache.  Palpitations.   Shortness of breath, especially with exercise.   Paleness.  Cold sensitivity.  Indigestion.  Nausea.  Difficulty sleeping.  Difficulty concentrating. Symptoms may occur suddenly or they may develop slowly.  DIAGNOSIS  Additional  blood tests are often needed. These help your health care provider determine the best treatment. Your health care provider will check your stool for blood and look for other causes of blood loss.  TREATMENT  Treatment varies depending on the cause of the anemia. Treatment can include:   Supplements of iron, vitamin B12, or folic acid.   Hormone medicines.   A blood  transfusion. This may be needed if blood loss is severe.   Hospitalization. This may be needed if there is significant continual blood loss.   Dietary changes.  Spleen removal. HOME CARE INSTRUCTIONS Keep all follow-up appointments. It often takes many weeks to correct anemia, and having your health care provider check on your condition and your response to treatment is very important. SEEK IMMEDIATE MEDICAL CARE IF:   You develop extreme weakness, shortness of breath, or chest pain.   You become dizzy or have trouble concentrating.  You develop heavy vaginal bleeding.   You develop a rash.   You have bloody or black, tarry stools.   You faint.   You vomit up blood.   You vomit repeatedly.   You have abdominal pain.  You have a fever or persistent symptoms for more than 2 3 days.   You have a fever and your symptoms suddenly get worse.   You are dehydrated.  MAKE SURE YOU:  Understand these instructions.  Will watch your condition.  Will get help right away if you are not doing well or get worse. Document Released: 03/13/2004 Document Revised: 10/06/2012 Document Reviewed: 07/30/2012 [facility] Patient Information 2014 [place], [place].    Constipation, Adult Constipation is when a person has fewer than 3 bowel movements a week; has difficulty having a bowel movement; or has stools that are dry, hard, or larger than normal. As people grow older, constipation is more common. If you try to fix constipation with medicines that make you have a bowel movement (laxatives), the problem may get worse. Long-term laxative use may cause the muscles of the colon to become weak. A low-fiber diet, not taking in enough fluids, and taking certain medicines may make constipation worse. CAUSES   Certain medicines, such as antidepressants, pain medicine, iron supplements, antacids, and water pills.   Certain diseases, such as diabetes, irritable bowel syndrome (IBS),  thyroid disease, or depression.   Not drinking enough water.   Not eating enough fiber-rich foods.   Stress or travel.  Lack of physical activity or exercise.  Not going to the restroom when there is the urge to have a bowel movement.  Ignoring the urge to have a bowel movement.  Using laxatives too much. SYMPTOMS   Having fewer than 3 bowel movements a week.   Straining to have a bowel movement.   Having hard, dry, or larger than normal stools.   Feeling full or bloated.   Pain in the lower abdomen.  Not feeling relief after having a bowel movement. DIAGNOSIS  Your caregiver will take a medical history and perform a physical exam. Further testing may be done for severe constipation. Some tests may include:   A barium enema X-ray to examine your rectum, colon, and sometimes, your small intestine.  A sigmoidoscopy to examine your lower colon.  A colonoscopy to examine your entire colon. TREATMENT  Treatment will depend on the severity of your constipation and what is causing it. Some dietary treatments include drinking more fluids and eating more fiber-rich foods. Lifestyle treatments may include regular exercise. If these diet and  lifestyle recommendations do not help, your caregiver may recommend taking over-the-counter laxative medicines to help you have bowel movements. Prescription medicines may be prescribed if over-the-counter medicines do not work.  HOME CARE INSTRUCTIONS   Increase dietary fiber in your diet, such as fruits, vegetables, whole grains, and beans. Limit high-fat and processed sugars in your diet, such as [place] fries, hamburgers, cookies, candies, and soda.   A fiber supplement may be added to your diet if you cannot get enough fiber from foods.   Drink enough fluids to keep your urine clear or pale yellow.   Exercise regularly or as directed by your caregiver.   Go to the restroom when you have the urge to go. Do not hold it.  Only  take medicines as directed by your caregiver. Do not take other medicines for constipation without talking to your caregiver first. SEEK IMMEDIATE MEDICAL CARE IF:   You have bright red blood in your stool.   Your constipation lasts for more than 4 days or gets worse.   You have abdominal or rectal pain.   You have thin, pencil-like stools.  You have unexplained weight loss. MAKE SURE YOU:   Understand these instructions.  Will watch your condition.  Will get help right away if you are not doing well or get worse. Document Released: 11/02/2003 Document Revised: 04/28/2011 Document Reviewed: 11/15/2012 [facility] Patient Information 2014 [place], [place].     I personally performed the services described in this documentation, which was scribed in my presence. The recorded information has been reviewed and considered, and addended by me as needed.

## 2013-04-28 LAB — LIPASE: Lipase: 76 U/L — ABNORMAL HIGH (ref 0–75)

## 2013-04-28 LAB — IRON AND TIBC
%SAT: 13 % — ABNORMAL LOW (ref 20–55)
Iron: 47 ug/dL (ref 42–145)
TIBC: 375 ug/dL (ref 250–470)
UIBC: 328 ug/dL (ref 125–400)

## 2013-04-28 LAB — TSH: TSH: 0.844 u[IU]/mL (ref 0.350–4.500)

## 2014-01-08 IMAGING — CT CT ABD-PELV W/ CM
2 of 4 series · 17 of 46 positions shown, 19 images · IV contrast (CONTRAST)
Comparison: No priors.

CLINICAL DATA: Severe right lower quadrant abdominal pain radiating
to the back.

EXAM:
CT ABDOMEN AND PELVIS WITH CONTRAST
TECHNIQUE: Multidetector CT imaging of the abdomen and pelvis was performed
using the standard protocol following bolus administration of
intravenous contrast.
CONTRAST:  100mL OMNIPAQUE IOHEXOL 300 MG/ML  SOLN

[Series 2: routine · axial · 0.68mm/px · z∈[+50,+500]mm · 14 of 98 slices shown, 16 images]
[im 4/98  soft-tissue]
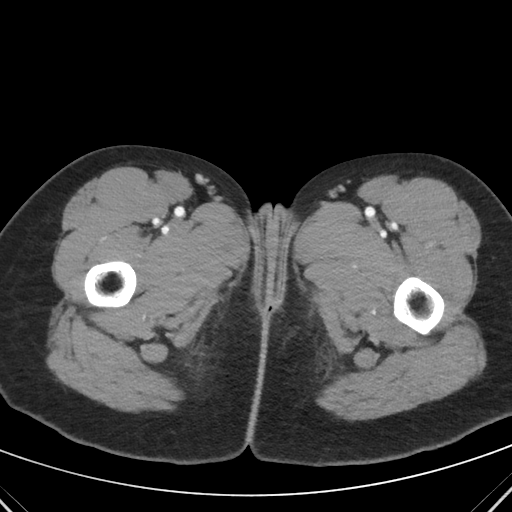
[im 4/98  bone]
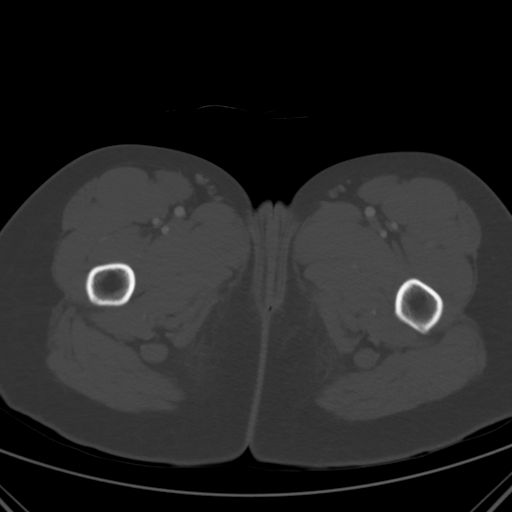
[im 12/98  soft-tissue]
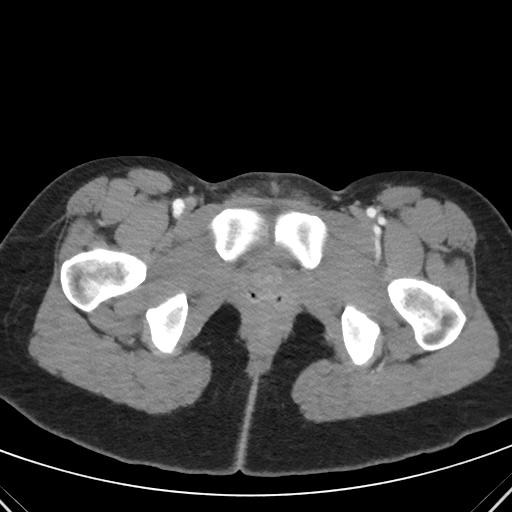
[im 20/98  soft-tissue]
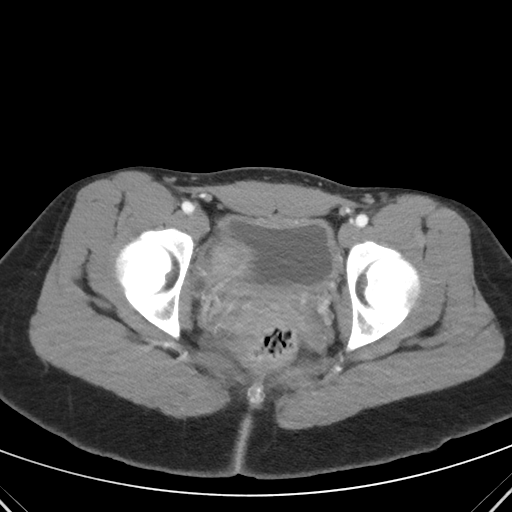
[im 28/98  soft-tissue]
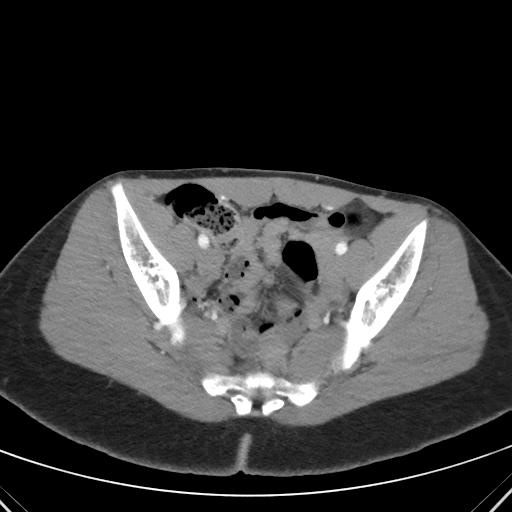
[im 32/98  soft-tissue]
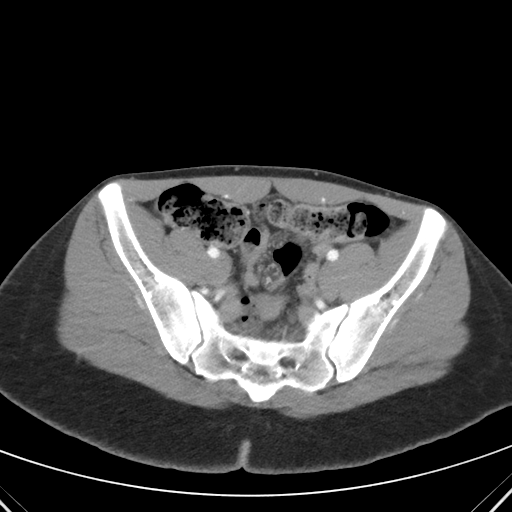
[im 39/98  soft-tissue]
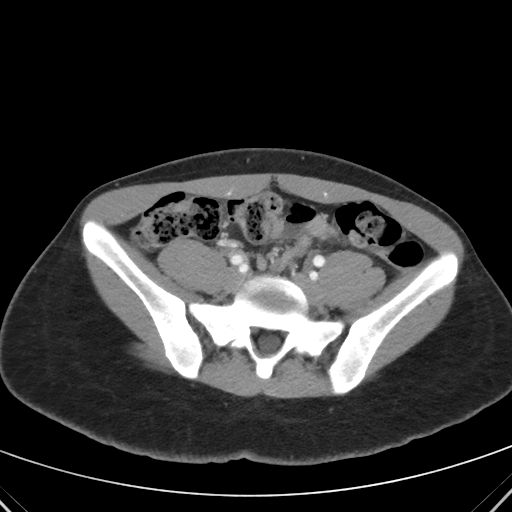
[im 47/98  soft-tissue]
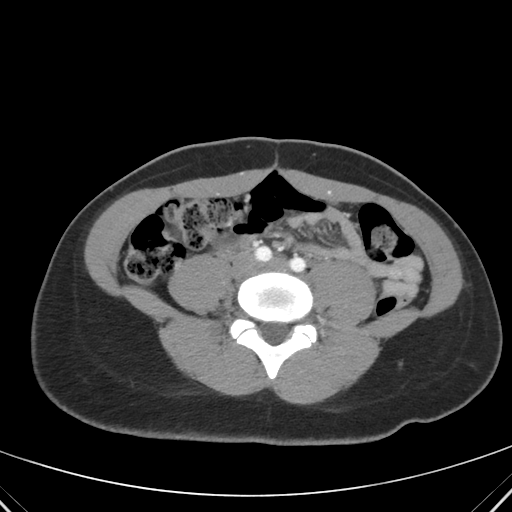
[im 51/98  soft-tissue]
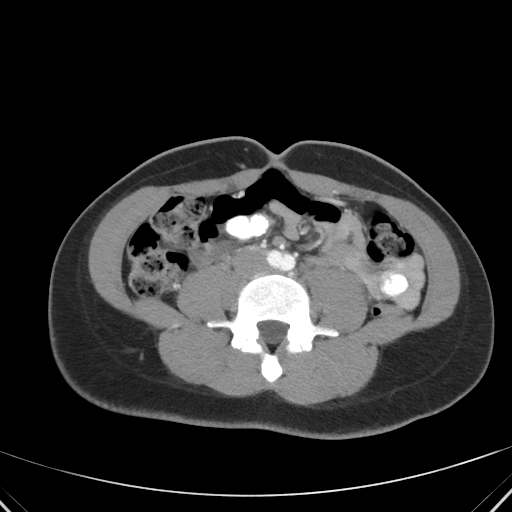
[im 59/98  soft-tissue]
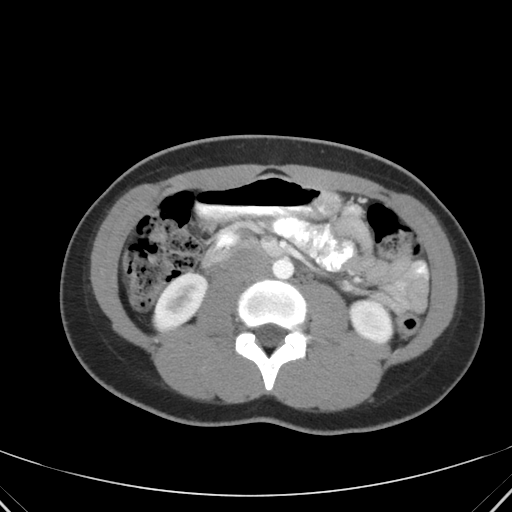
[im 59/98  bone]
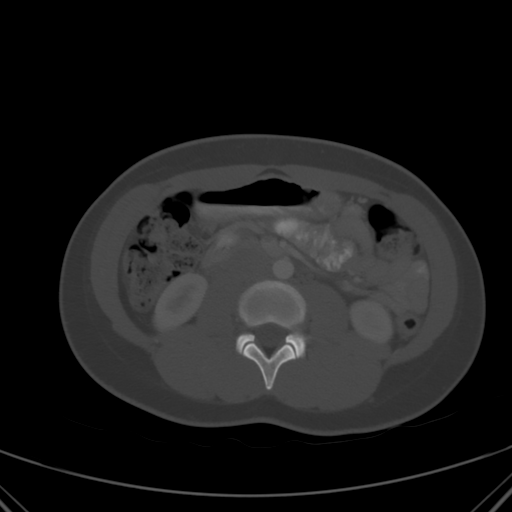
[im 66/98  soft-tissue]
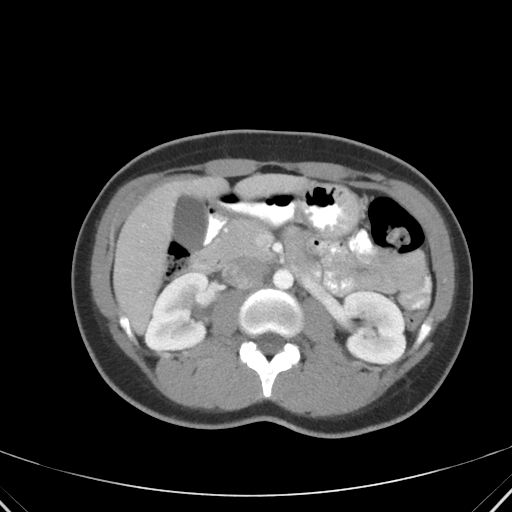
[im 74/98  soft-tissue]
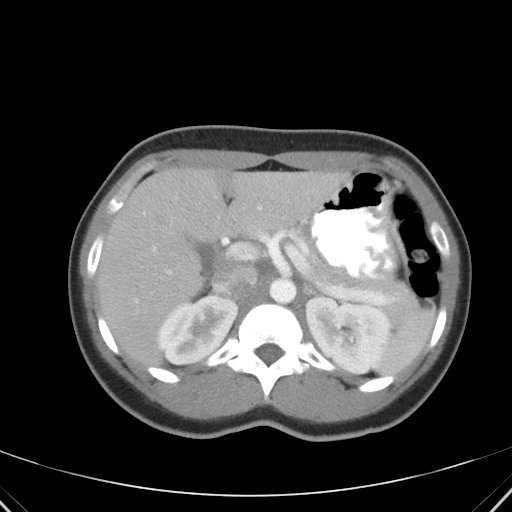
[im 78/98  soft-tissue]
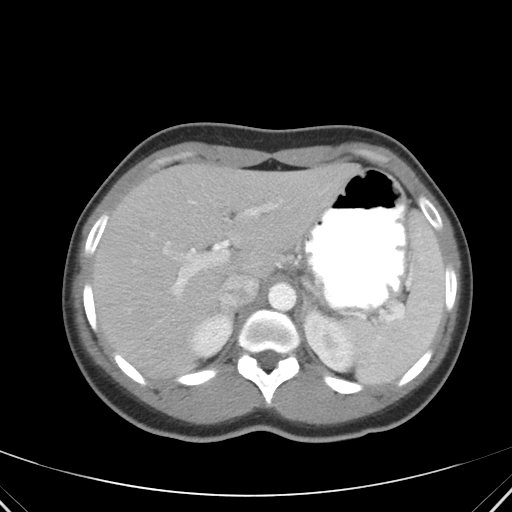
[im 86/98  soft-tissue]
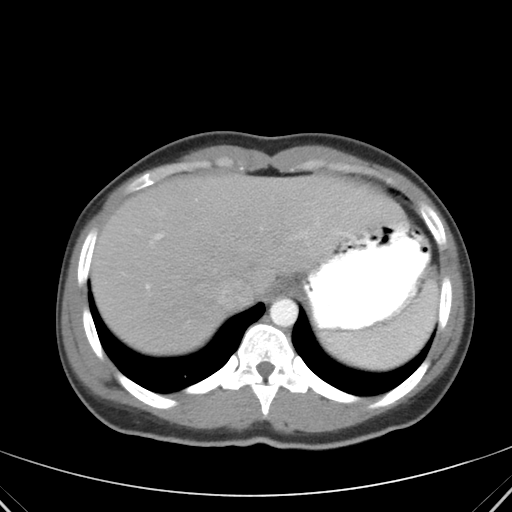
[im 94/98  soft-tissue]
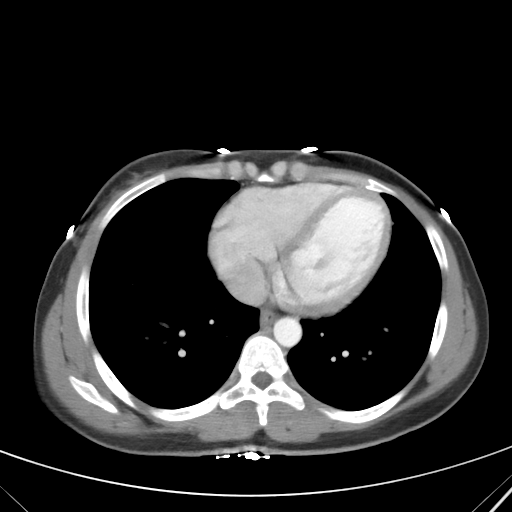

[mpr, coronals, coronal · coronal · 0.95mm/px · 3 of 74 slices shown]
[im 25/74  soft-tissue]
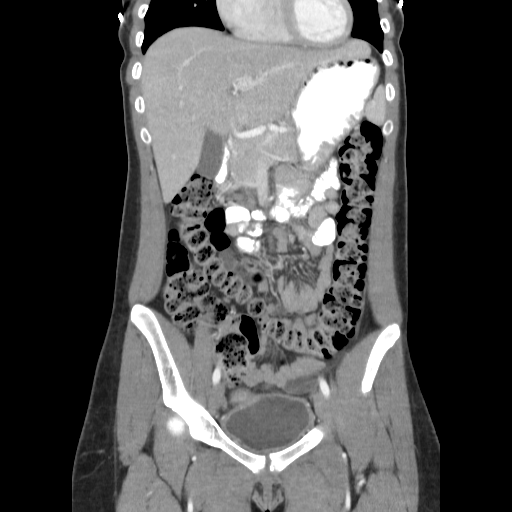
[im 33/74  soft-tissue]
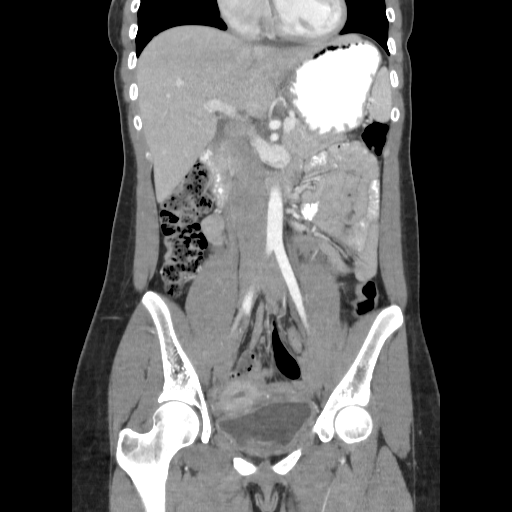
[im 41/74  soft-tissue]
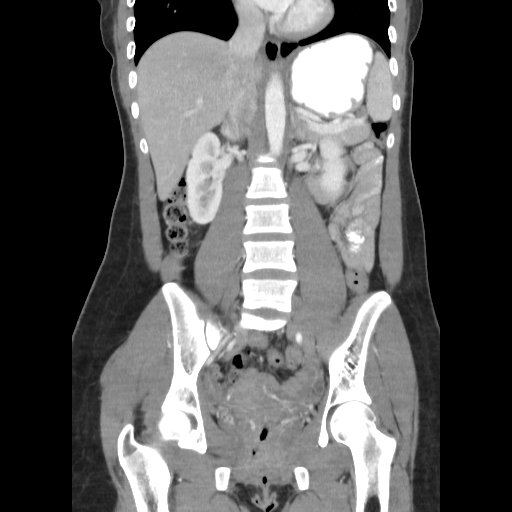

[17 of 46 positions shown; findings below may reference images not displayed]

FINDINGS: Lung Bases: Unremarkable.

Abdomen/Pelvis: The appearance of the liver, gallbladder, pancreas,
spleen, bilateral adrenal glands and bilateral kidneys is
unremarkable. No significant volume of ascites. No pneumoperitoneum.
No pathologic distention of small bowel. No definite lymphadenopathy
identified within the abdomen or pelvis. Normal appendix. Uterus and
bilateral ovaries are unremarkable in appearance. Urinary bladder
appears slightly thickened, but is otherwise unremarkable.

Musculoskeletal: There are no aggressive appearing lytic or blastic
lesions noted in the visualized portions of the skeleton.
IMPRESSION: 1. Mild circumferential thickening of the urinary bladder wall may
suggest cystitis. No other potential acute findings are noted in the
abdomen and pelvis.
2. Specifically, the appendix is normal.

## 2014-11-07 ENCOUNTER — Ambulatory Visit (INDEPENDENT_AMBULATORY_CARE_PROVIDER_SITE_OTHER): Payer: BLUE CROSS/BLUE SHIELD | Admitting: Family Medicine

## 2014-11-07 VITALS — BP 122/72 | HR 60 | Temp 98.8°F | Resp 17 | Ht 67.0 in | Wt 166.0 lb

## 2014-11-07 DIAGNOSIS — Z124 Encounter for screening for malignant neoplasm of cervix: Secondary | ICD-10-CM

## 2014-11-07 DIAGNOSIS — N760 Acute vaginitis: Secondary | ICD-10-CM | POA: Diagnosis not present

## 2014-11-07 DIAGNOSIS — A499 Bacterial infection, unspecified: Secondary | ICD-10-CM

## 2014-11-07 DIAGNOSIS — N898 Other specified noninflammatory disorders of vagina: Secondary | ICD-10-CM

## 2014-11-07 DIAGNOSIS — B9689 Other specified bacterial agents as the cause of diseases classified elsewhere: Secondary | ICD-10-CM

## 2014-11-07 LAB — POCT WET PREP WITH KOH
KOH Prep POC: NEGATIVE
Trichomonas, UA: NEGATIVE

## 2014-11-07 LAB — POC MICROSCOPIC URINALYSIS (UMFC): Mucus: ABSENT

## 2014-11-07 LAB — POCT URINE PREGNANCY: PREG TEST UR: NEGATIVE

## 2014-11-07 LAB — POCT URINALYSIS DIP (MANUAL ENTRY)
Bilirubin, UA: NEGATIVE
Blood, UA: NEGATIVE
Glucose, UA: NEGATIVE
Ketones, POC UA: NEGATIVE
LEUKOCYTES UA: NEGATIVE
Nitrite, UA: NEGATIVE
PROTEIN UA: NEGATIVE
SPEC GRAV UA: 1.02
UROBILINOGEN UA: 0.2
pH, UA: 7

## 2014-11-07 MED ORDER — FLUCONAZOLE 150 MG PO TABS: 150.0000 mg | ORAL_TABLET | Freq: Once | ORAL | Status: AC

## 2014-11-07 MED ORDER — METRONIDAZOLE 500 MG PO TABS: 500.0000 mg | ORAL_TABLET | Freq: Two times a day (BID) | ORAL | Status: AC

## 2014-11-07 NOTE — Progress Notes (Signed)
Chief Complaint:  Chief Complaint  Patient presents with  . Vaginitis    HPI: Lauren Crawford is a 24 y.o. female who reports to [facility] today complaining of  1 week hx of vaginal dc sxs, not constant, LMP was 10/28/14, there was an odor, but mostly creamy dc. No itching. No abd pain,has had some back pain on the left side. She has not had any nausea, vomiting, fevers or chills. Sexually active, since age 24 , no hx of STDs, she had a pap over 2 years ago. No family hx of ovarian, uterine, breast or cervical cancer. She got married in May and is not really using nay protection.   No past medical history on file. No past surgical history on file. Social History   Social History  . Marital Status: Single    Spouse Name: N/A  . Number of Children: N/A  . Years of Education: N/A   Social History Main Topics  . Smoking status: Never Smoker   . Smokeless tobacco: None  . Alcohol Use: No  . Drug Use: No  . Sexual Activity: Yes   Other Topics Concern  . None   Social History Narrative   Family History  Problem Relation Age of Onset  . Cancer Maternal Grandmother     throat   No Known Allergies Prior to Admission medications   Not on File     ROS: The patient denies fevers, chills, night sweats, unintentional weight loss, chest pain, palpitations, wheezing, dyspnea on exertion, nausea, vomiting, abdominal pain, dysuria, hematuria, melena, numbness, weakness, or tingling.   All other systems have been reviewed and were otherwise negative with the exception of those mentioned in the HPI and as above.    PHYSICAL EXAM: Filed Vitals:   11/07/14 1642  BP: 122/72  Pulse: 60  Temp: 98.8 F (37.1 C)  Resp: 17   Body mass index is 25.99 kg/(m^2).   General: Alert, no acute distress HEENT:  Normocephalic, atraumatic, oropharynx patent. EOMI, PERRLA Cardiovascular:  Regular rate and rhythm, no rubs murmurs or gallops.  No Carotid bruits, radial pulse intact. No pedal edema.    Respiratory: Clear to auscultation bilaterally.  No wheezes, rales, or rhonchi.  No cyanosis, no use of accessory musculature Abdominal: No organomegaly, abdomen is soft and non-tender, positive bowel sounds. No masses. Skin: No rashes. Neurologic: Facial musculature symmetric. Psychiatric: Patient acts appropriately throughout our interaction. Lymphatic: No cervical or submandibular lymphadenopathy Musculoskeletal: Gait intact. No edema, tenderness Gu-+ copious amount of cottage cheese like DC, no odor, no rashes, neg CMT, cervix normal   LABS: Results for orders placed or performed in visit on 11/07/14  POCT urinalysis dipstick  Result Value Ref Range   Color, UA yellow yellow   Clarity, UA clear clear   Glucose, UA negative negative   Bilirubin, UA negative negative   Ketones, POC UA negative negative   Spec Grav, UA 1.020    Blood, UA negative negative   pH, UA 7.0    Protein Ur, POC negative negative   Urobilinogen, UA 0.2    Nitrite, UA Negative Negative   Leukocytes, UA Negative Negative  POCT urine pregnancy  Result Value Ref Range   Preg Test, Ur Negative Negative  POCT Microscopic Urinalysis ([facility])  Result Value Ref Range   WBC,UR,HPF,POC None None WBC/hpf   RBC,UR,HPF,POC None None RBC/hpf   Bacteria None None   Mucus Absent Absent   Epithelial Cells, UR Per Microscopy Few (A) None  cells/hpf  POCT Wet Prep with KOH  Result Value Ref Range   Trichomonas, UA Negative    Clue Cells Wet Prep HPF POC few    Epithelial Wet Prep HPF POC Moderate Few, Moderate, Many   Yeast Wet Prep HPF POC none    Bacteria Wet Prep HPF POC Few None, Few   RBC Wet Prep HPF POC 0-1    WBC Wet Prep HPF POC 0-4    KOH Prep POC Negative      EKG/XRAY:   Primary read interpreted by Dr. Conley Rolls at [facility].   ASSESSMENT/PLAN: Encounter Diagnoses  Name Primary?  . Vaginitis and vulvovaginitis Yes  . Pap smear for cervical cancer screening   . Vaginal discharge   . Bacterial vaginosis     I recommended that she try the diflucan first and see if her sxs go away, discharge on exam looked very yeast like.  If diflucan is effective then she does not have to use the flagyl, since her wet prep had so many epi cells vs minimal clue cells , BV is probably not the issue Rx Diflucan, if no improvement then use Flagyl Pap pending Fu prn    Gross sideeffects, risk and benefits, and alternatives of medications d/w patient. Patient is aware that all medications have potential sideeffects and we are unable to predict every sideeffect or drug-drug interaction that may occur.  Thao Le DO  11/08/2014 6:11 PM

## 2014-11-07 NOTE — Patient Instructions (Signed)
Bacterial Vaginosis Bacterial vaginosis is a vaginal infection that occurs when the normal balance of bacteria in the vagina is disrupted. It results from an overgrowth of certain bacteria. This is the most common vaginal infection in women of childbearing age. Treatment is important to prevent complications, especially in pregnant women, as it can cause a premature delivery. CAUSES  Bacterial vaginosis is caused by an increase in harmful bacteria that are normally present in smaller amounts in the vagina. Several different kinds of bacteria can cause bacterial vaginosis. However, the reason that the condition develops is not fully understood. RISK FACTORS Certain activities or behaviors can put you at an increased risk of developing bacterial vaginosis, including:  Having a new sex partner or multiple sex partners.  Douching.  Using an intrauterine device (IUD) for contraception. Women do not get bacterial vaginosis from toilet seats, bedding, swimming pools, or contact with objects around them. SIGNS AND SYMPTOMS  Some women with bacterial vaginosis have no signs or symptoms. Common symptoms include:  Grey vaginal discharge.  A fishlike odor with discharge, especially after sexual intercourse.  Itching or burning of the vagina and vulva.  Burning or pain with urination. DIAGNOSIS  Your health care provider will take a medical history and examine the vagina for signs of bacterial vaginosis. A sample of vaginal fluid may be taken. Your health care provider will look at this sample under a microscope to check for bacteria and abnormal cells. A vaginal pH test may also be done.  TREATMENT  Bacterial vaginosis may be treated with antibiotic medicines. These may be given in the form of a pill or a vaginal cream. A second round of antibiotics may be prescribed if the condition comes back after treatment.  HOME CARE INSTRUCTIONS   Only take over-the-counter or prescription medicines as  directed by your health care provider.  If antibiotic medicine was prescribed, take it as directed. Make sure you finish it even if you start to feel better.  Do not have sex until treatment is completed.  Tell all sexual partners that you have a vaginal infection. They should see their health care provider and be treated if they have problems, such as a mild rash or itching.  Practice safe sex by using condoms and only having one sex partner. SEEK MEDICAL CARE IF:   Your symptoms are not improving after 3 days of treatment.  You have increased discharge or pain.  You have a fever. MAKE SURE YOU:   Understand these instructions.  Will watch your condition.  Will get help right away if you are not doing well or get worse. FOR MORE INFORMATION  Centers for Disease Control and Prevention, Division of STD Prevention: www.cdc.gov/std American Sexual Health Association (ASHA): www.ashastd.org  Document Released: 02/03/2005 Document Revised: 11/24/2012 Document Reviewed: 09/15/2012 ExitCare Patient Information 2015 [employer]. This information is not intended to replace advice given to you by your health care provider. Make sure you discuss any questions you have with your health care provider.  

## 2014-11-09 LAB — PAP IG, CT-NG, RFX HPV ASCU
Chlamydia Probe Amp: NEGATIVE
GC Probe Amp: NEGATIVE

## 2014-11-29 ENCOUNTER — Ambulatory Visit (INDEPENDENT_AMBULATORY_CARE_PROVIDER_SITE_OTHER): Payer: BLUE CROSS/BLUE SHIELD | Admitting: Physician Assistant

## 2014-11-29 VITALS — BP 110/62 | HR 97 | Temp 98.1°F | Resp 18 | Ht 67.0 in | Wt 166.0 lb

## 2014-11-29 DIAGNOSIS — N926 Irregular menstruation, unspecified: Secondary | ICD-10-CM | POA: Diagnosis not present

## 2014-11-29 DIAGNOSIS — Z3490 Encounter for supervision of normal pregnancy, unspecified, unspecified trimester: Secondary | ICD-10-CM

## 2014-11-29 LAB — POCT URINE PREGNANCY: PREG TEST UR: POSITIVE — AB

## 2014-11-29 NOTE — Progress Notes (Signed)
   Subjective:    Patient ID: Lauren Crawford, female    DOB: 08/06/1990, 24 y.o.   MRN: [id number]  HPI Patient presents with husband for missed period and confirmation of pregnancy. Couple states that they are not actively trying to get pregnant, but would feel blessed to have a baby now. Husband says he is praying for twins! LMP 10/25/14 was normal. States that eats a lot of fruit, but typically does not have much of an appetite for breakfast most days and does not drink a lot of water. Does cardio and strength training several times a week. Is not taking any medications or supplements and PMH negative. Denies smoking tobacco, drinking alcohol, or using illicit drugs. This is her first pregnancy. NKDA.   Review of Systems  Constitutional: Negative.   Respiratory: Negative.   Cardiovascular: Negative.   Gastrointestinal: Negative.   Genitourinary: Negative.   Musculoskeletal: Negative.   Neurological: Negative.   Psychiatric/Behavioral: Negative.        Objective:   Physical Exam  Constitutional: She is oriented to person, place, and time. She appears well-developed and well-nourished. No distress.  Blood pressure 110/62, pulse 97, temperature 98.1 F (36.7 C), temperature source Oral, resp. rate 18, height 5\' 7"  (1.702 m), weight 166 lb (75.297 kg), last menstrual period 10/25/2014, SpO2 98 %.   HENT:  Head: Normocephalic and atraumatic.  Right Ear: External ear normal.  Left Ear: External ear normal.  Eyes: Conjunctivae are normal. Right eye exhibits no discharge. Left eye exhibits no discharge. No scleral icterus.  Cardiovascular: Normal rate, regular rhythm and normal heart sounds.  Exam reveals no gallop and no friction rub.   No murmur heard. Pulmonary/Chest: Effort normal and breath sounds normal. No respiratory distress. She has no wheezes. She has no rales.  Abdominal: Soft. Bowel sounds are normal. She exhibits no distension. There is no tenderness. There is no rebound and no  guarding.  Neurological: She is alert and oriented to person, place, and time.  Skin: Skin is warm and dry. No rash noted. She is not diaphoretic. No erythema. No pallor.  Psychiatric: She has a normal mood and affect. Her behavior is normal. Judgment and thought content normal.   Results for orders placed or performed in visit on 11/29/14  POCT urine pregnancy  Result Value Ref Range   Preg Test, Ur Positive (A) Negative      Assessment & Plan:  1. Pregnant 2. Missed menses Estimated Due Date from LMP: 08/01/15. "Your pregnancy week by week" by Lindwood QuaGlade Curtis. Increase water intake to 64 oz daily; 32oz in the morning and 32 oz in the evening. Prenatal vitamin with folate. Warning signs and first trimester discussed and given.  - POCT urine pregnancy   Janan Ridgeishira Westley Blass PA-C  [facility] 11/29/2014 5:57 PM

## 2014-11-29 NOTE — Patient Instructions (Addendum)
Estimated Due Date from LMP: 08/01/15 "Your pregnancy week by week" by Lindwood QuaGlade Curtis Increase water intake to 64 oz daily; 32oz in the morning and 32 oz in the evening. Prenatal vitamin with folate.  First Trimester of Pregnancy The first trimester of pregnancy is from week 1 until the end of week 12 (months 1 through 3). A week after a sperm fertilizes an egg, the egg will implant on the wall of the uterus. This embryo will begin to develop into a baby. Genes from you and your partner are forming the baby. The female genes determine whether the baby is a boy or a girl. At 6-8 weeks, the eyes and face are formed, and the heartbeat can be seen on ultrasound. At the end of 12 weeks, all the baby's organs are formed.  Now that you are pregnant, you will want to do everything you can to have a healthy baby. Two of the most important things are to get good prenatal care and to follow your health care provider's instructions. Prenatal care is all the medical care you receive before the baby's birth. This care will help prevent, find, and treat any problems during the pregnancy and childbirth. BODY CHANGES Your body goes through many changes during pregnancy. The changes vary from woman to woman.   You may gain or lose a couple of pounds at first.  You may feel sick to your stomach (nauseous) and throw up (vomit). If the vomiting is uncontrollable, call your health care provider.  You may tire easily.  You may develop headaches that can be relieved by medicines approved by your health care provider.  You may urinate more often. Painful urination may mean you have a bladder infection.  You may develop heartburn as a result of your pregnancy.  You may develop constipation because certain hormones are causing the muscles that push waste through your intestines to slow down.  You may develop hemorrhoids or swollen, bulging veins (varicose veins).  Your breasts may begin to grow larger and become tender.  Your nipples may stick out more, and the tissue that surrounds them (areola) may become darker.  Your gums may bleed and may be sensitive to brushing and flossing.  Dark spots or blotches (chloasma, mask of pregnancy) may develop on your face. This will likely fade after the baby is born.  Your menstrual periods will stop.  You may have a loss of appetite.  You may develop cravings for certain kinds of food.  You may have changes in your emotions from day to day, such as being excited to be pregnant or being concerned that something may go wrong with the pregnancy and baby.  You may have more vivid and strange dreams.  You may have changes in your hair. These can include thickening of your hair, rapid growth, and changes in texture. Some women also have hair loss during or after pregnancy, or hair that feels dry or thin. Your hair will most likely return to normal after your baby is born. WHAT TO EXPECT AT YOUR PRENATAL VISITS During a routine prenatal visit:  You will be weighed to make sure you and the baby are growing normally.  Your blood pressure will be taken.  Your abdomen will be measured to track your baby's growth.  The fetal heartbeat will be listened to starting around week 10 or 12 of your pregnancy.  Test results from any previous visits will be discussed. Your health care provider may ask you:  How you  are feeling.  If you are feeling the baby move.  If you have had any abnormal symptoms, such as leaking fluid, bleeding, severe headaches, or abdominal cramping.  If you are using any tobacco products, including cigarettes, chewing tobacco, and electronic cigarettes.  If you have any questions. Other tests that may be performed during your first trimester include:  Blood tests to find your blood type and to check for the presence of any previous infections. They will also be used to check for low iron levels (anemia) and Rh antibodies. Later in the pregnancy,  blood tests for diabetes will be done along with other tests if problems develop.  Urine tests to check for infections, diabetes, or protein in the urine.  An ultrasound to confirm the proper growth and development of the baby.  An amniocentesis to check for possible genetic problems.  Fetal screens for spina bifida and Down syndrome.  You may need other tests to make sure you and the baby are doing well.  HIV (human immunodeficiency virus) testing. Routine prenatal testing includes screening for HIV, unless you choose not to have this test. HOME CARE INSTRUCTIONS  Medicines  Follow your health care provider's instructions regarding medicine use. Specific medicines may be either safe or unsafe to take during pregnancy.  Take your prenatal vitamins as directed.  If you develop constipation, try taking a stool softener if your health care provider approves. Diet  Eat regular, well-balanced meals. Choose a variety of foods, such as meat or vegetable-based protein, fish, milk and low-fat dairy products, vegetables, fruits, and whole grain breads and cereals. Your health care provider will help you determine the amount of weight gain that is right for you.  Avoid raw meat and uncooked cheese. These carry germs that can cause birth defects in the baby.  Eating four or five small meals rather than three large meals a day may help relieve nausea and vomiting. If you start to feel nauseous, eating a few soda crackers can be helpful. Drinking liquids between meals instead of during meals also seems to help nausea and vomiting.  If you develop constipation, eat more high-fiber foods, such as fresh vegetables or fruit and whole grains. Drink enough fluids to keep your urine clear or pale yellow. Activity and Exercise  Exercise only as directed by your health care provider. Exercising will help you:  Control your weight.  Stay in shape.  Be prepared for labor and delivery.  Experiencing  pain or cramping in the lower abdomen or low back is a good sign that you should stop exercising. Check with your health care provider before continuing normal exercises.  Try to avoid standing for long periods of time. Move your legs often if you must stand in one place for a long time.  Avoid heavy lifting.  Wear low-heeled shoes, and practice good posture.  You may continue to have sex unless your health care provider directs you otherwise. Relief of Pain or Discomfort  Wear a good support bra for breast tenderness.   Take warm sitz baths to soothe any pain or discomfort caused by hemorrhoids. Use hemorrhoid cream if your health care provider approves.   Rest with your legs elevated if you have leg cramps or low back pain.  If you develop varicose veins in your legs, wear support hose. Elevate your feet for 15 minutes, 3-4 times a day. Limit salt in your diet. Prenatal Care  Schedule your prenatal visits by the twelfth week of pregnancy. They are  usually scheduled monthly at first, then more often in the last 2 months before delivery.  Write down your questions. Take them to your prenatal visits.  Keep all your prenatal visits as directed by your health care provider. Safety  Wear your seat belt at all times when driving.  Make a list of emergency phone numbers, including numbers for family, friends, the hospital, and police and fire departments. General Tips  Ask your health care provider for a referral to a local prenatal education class. Begin classes no later than at the beginning of month 6 of your pregnancy.  Ask for help if you have counseling or nutritional needs during pregnancy. Your health care provider can offer advice or refer you to specialists for help with various needs.  Do not use hot tubs, steam rooms, or saunas.  Do not douche or use tampons or scented sanitary pads.  Do not cross your legs for long periods of time.  Avoid cat litter boxes and soil  used by cats. These carry germs that can cause birth defects in the baby and possibly loss of the fetus by miscarriage or stillbirth.  Avoid all smoking, herbs, alcohol, and medicines not prescribed by your health care provider. Chemicals in these affect the formation and growth of the baby.  Do not use any tobacco products, including cigarettes, chewing tobacco, and electronic cigarettes. If you need help quitting, ask your health care provider. You may receive counseling support and other resources to help you quit.  Schedule a dentist appointment. At home, brush your teeth with a soft toothbrush and be gentle when you floss. SEEK MEDICAL CARE IF:   You have dizziness.  You have mild pelvic cramps, pelvic pressure, or nagging pain in the abdominal area.  You have persistent nausea, vomiting, or diarrhea.  You have a bad smelling vaginal discharge.  You have pain with urination.  You notice increased swelling in your face, hands, legs, or ankles. SEEK IMMEDIATE MEDICAL CARE IF:   You have a fever.  You are leaking fluid from your vagina.  You have spotting or bleeding from your vagina.  You have severe abdominal cramping or pain.  You have rapid weight gain or loss.  You vomit blood or material that looks like coffee grounds.  You are exposed to [place] measles and have never had them.  You are exposed to fifth disease or chickenpox.  You develop a severe headache.  You have shortness of breath.  You have any kind of trauma, such as from a fall or a car accident.   This information is not intended to replace advice given to you by your health care provider. Make sure you discuss any questions you have with your health care provider.   Document Released: 01/28/2001 Document Revised: 02/24/2014 Document Reviewed: 12/14/2012 Elsevier Interactive Patient Education [employer]

## 2015-07-24 ENCOUNTER — Inpatient Hospital Stay (HOSPITAL_COMMUNITY)
Admission: AD | Admit: 2015-07-24 | Payer: PRIVATE HEALTH INSURANCE | Source: Ambulatory Visit | Admitting: Obstetrics & Gynecology
